# Patient Record
Sex: Male | Born: 1940 | Race: Black or African American | Hispanic: No | Marital: Single
Health system: Southern US, Community
[De-identification: ages and names within clinical notes are randomized; demographics above are authoritative.]

---

## 2014-12-12 ENCOUNTER — Other Ambulatory Visit (HOSPITAL_COMMUNITY): Payer: Self-pay

## 2014-12-12 ENCOUNTER — Inpatient Hospital Stay
Admission: AD | Admit: 2014-12-12 | Discharge: 2015-01-08 | Disposition: E | Payer: Self-pay | Source: Other Acute Inpatient Hospital | Attending: Internal Medicine | Admitting: Internal Medicine

## 2014-12-12 DIAGNOSIS — Z931 Gastrostomy status: Secondary | ICD-10-CM

## 2014-12-12 DIAGNOSIS — I5030 Unspecified diastolic (congestive) heart failure: Secondary | ICD-10-CM

## 2014-12-12 DIAGNOSIS — Z4659 Encounter for fitting and adjustment of other gastrointestinal appliance and device: Secondary | ICD-10-CM

## 2014-12-12 DIAGNOSIS — I82409 Acute embolism and thrombosis of unspecified deep veins of unspecified lower extremity: Secondary | ICD-10-CM

## 2014-12-12 DIAGNOSIS — R140 Abdominal distension (gaseous): Secondary | ICD-10-CM

## 2014-12-12 DIAGNOSIS — J9690 Respiratory failure, unspecified, unspecified whether with hypoxia or hypercapnia: Secondary | ICD-10-CM

## 2014-12-12 DIAGNOSIS — I82403 Acute embolism and thrombosis of unspecified deep veins of lower extremity, bilateral: Secondary | ICD-10-CM

## 2014-12-12 DIAGNOSIS — R0602 Shortness of breath: Secondary | ICD-10-CM

## 2014-12-12 DIAGNOSIS — J9600 Acute respiratory failure, unspecified whether with hypoxia or hypercapnia: Secondary | ICD-10-CM

## 2014-12-12 DIAGNOSIS — I2699 Other pulmonary embolism without acute cor pulmonale: Secondary | ICD-10-CM

## 2014-12-13 ENCOUNTER — Other Ambulatory Visit (HOSPITAL_COMMUNITY): Payer: Self-pay

## 2014-12-13 LAB — BLOOD GAS, ARTERIAL
ACID-BASE EXCESS: 6.1 mmol/L — AB (ref 0.0–2.0)
Bicarbonate: 29.3 mEq/L — ABNORMAL HIGH (ref 20.0–24.0)
O2 Content: 2 L/min
O2 Saturation: 95.3 %
PATIENT TEMPERATURE: 98.6
PCO2 ART: 36.6 mmHg (ref 35.0–45.0)
PH ART: 7.514 — AB (ref 7.350–7.450)
PO2 ART: 74.9 mmHg — AB (ref 80.0–100.0)
TCO2: 30.4 mmol/L (ref 0–100)

## 2014-12-13 LAB — COMPREHENSIVE METABOLIC PANEL
ALT: 46 U/L (ref 17–63)
AST: 42 U/L — ABNORMAL HIGH (ref 15–41)
Albumin: 1.6 g/dL — ABNORMAL LOW (ref 3.5–5.0)
Alkaline Phosphatase: 53 U/L (ref 38–126)
Anion gap: 9 (ref 5–15)
BUN: 12 mg/dL (ref 6–20)
CHLORIDE: 101 mmol/L (ref 101–111)
CO2: 31 mmol/L (ref 22–32)
CREATININE: 0.71 mg/dL (ref 0.61–1.24)
Calcium: 7.6 mg/dL — ABNORMAL LOW (ref 8.9–10.3)
Glucose, Bld: 117 mg/dL — ABNORMAL HIGH (ref 65–99)
POTASSIUM: 2.7 mmol/L — AB (ref 3.5–5.1)
Sodium: 141 mmol/L (ref 135–145)
TOTAL PROTEIN: 5.4 g/dL — AB (ref 6.5–8.1)
Total Bilirubin: 1.1 mg/dL (ref 0.3–1.2)

## 2014-12-13 LAB — URINALYSIS, ROUTINE W REFLEX MICROSCOPIC
Glucose, UA: NEGATIVE mg/dL
HGB URINE DIPSTICK: NEGATIVE
KETONES UR: 40 mg/dL — AB
Leukocytes, UA: NEGATIVE
NITRITE: NEGATIVE
PH: 7 (ref 5.0–8.0)
Protein, ur: NEGATIVE mg/dL
Specific Gravity, Urine: 1.018 (ref 1.005–1.030)
UROBILINOGEN UA: 1 mg/dL (ref 0.0–1.0)

## 2014-12-13 LAB — CBC
HCT: 30.3 % — ABNORMAL LOW (ref 39.0–52.0)
Hemoglobin: 9.9 g/dL — ABNORMAL LOW (ref 13.0–17.0)
MCH: 32.6 pg (ref 26.0–34.0)
MCHC: 32.7 g/dL (ref 30.0–36.0)
MCV: 99.7 fL (ref 78.0–100.0)
PLATELETS: 291 10*3/uL (ref 150–400)
RBC: 3.04 MIL/uL — ABNORMAL LOW (ref 4.22–5.81)
RDW: 15.9 % — AB (ref 11.5–15.5)
WBC: 12.3 10*3/uL — ABNORMAL HIGH (ref 4.0–10.5)

## 2014-12-13 LAB — TSH: TSH: 0.789 u[IU]/mL (ref 0.350–4.500)

## 2014-12-13 LAB — PROCALCITONIN: PROCALCITONIN: 0.35 ng/mL

## 2014-12-14 LAB — URINE CULTURE: CULTURE: NO GROWTH

## 2014-12-14 LAB — HEMOGLOBIN AND HEMATOCRIT, BLOOD
HEMATOCRIT: 31.5 % — AB (ref 39.0–52.0)
HEMOGLOBIN: 10 g/dL — AB (ref 13.0–17.0)

## 2014-12-15 ENCOUNTER — Other Ambulatory Visit (HOSPITAL_COMMUNITY): Payer: Self-pay

## 2014-12-15 LAB — BASIC METABOLIC PANEL
Anion gap: 9 (ref 5–15)
BUN: 15 mg/dL (ref 6–20)
CHLORIDE: 102 mmol/L (ref 101–111)
CO2: 34 mmol/L — ABNORMAL HIGH (ref 22–32)
CREATININE: 0.7 mg/dL (ref 0.61–1.24)
Calcium: 7.8 mg/dL — ABNORMAL LOW (ref 8.9–10.3)
GFR calc non Af Amer: >60 mL/min (ref >60)
Glucose, Bld: 160 mg/dL — ABNORMAL HIGH (ref 65–99)
Potassium: 2.5 mmol/L — CL (ref 3.5–5.1)
SODIUM: 145 mmol/L (ref 135–145)

## 2014-12-15 LAB — CBC
HCT: 30.2 % — ABNORMAL LOW (ref 39.0–52.0)
HEMOGLOBIN: 9.9 g/dL — AB (ref 13.0–17.0)
MCH: 32.1 pg (ref 26.0–34.0)
MCHC: 32.8 g/dL (ref 30.0–36.0)
MCV: 98.1 fL (ref 78.0–100.0)
Platelets: 306 10*3/uL (ref 150–400)
RBC: 3.08 MIL/uL — AB (ref 4.22–5.81)
RDW: 16.1 % — ABNORMAL HIGH (ref 11.5–15.5)
WBC: 10.8 10*3/uL — ABNORMAL HIGH (ref 4.0–10.5)

## 2014-12-15 LAB — VANCOMYCIN, TROUGH: VANCOMYCIN TR: 17 ug/mL (ref 10.0–20.0)

## 2014-12-15 LAB — BRAIN NATRIURETIC PEPTIDE: B NATRIURETIC PEPTIDE 5: 384.5 pg/mL — AB (ref 0.0–100.0)

## 2014-12-15 LAB — HEMOGLOBIN A1C
Hgb A1c MFr Bld: 6 % — ABNORMAL HIGH (ref 4.8–5.6)
Mean Plasma Glucose: 126 mg/dL

## 2014-12-16 LAB — POTASSIUM: POTASSIUM: 2.8 mmol/L — AB (ref 3.5–5.1)

## 2014-12-17 ENCOUNTER — Other Ambulatory Visit (HOSPITAL_COMMUNITY): Payer: Self-pay

## 2014-12-17 LAB — URINE MICROSCOPIC-ADD ON

## 2014-12-17 LAB — BASIC METABOLIC PANEL
ANION GAP: 11 (ref 5–15)
BUN: 24 mg/dL — AB (ref 6–20)
CO2: 28 mmol/L (ref 22–32)
Calcium: 7.9 mg/dL — ABNORMAL LOW (ref 8.9–10.3)
Chloride: 108 mmol/L (ref 101–111)
Creatinine, Ser: 0.7 mg/dL (ref 0.61–1.24)
GFR calc Af Amer: >60 mL/min (ref >60)
GLUCOSE: 145 mg/dL — AB (ref 65–99)
POTASSIUM: 2.9 mmol/L — AB (ref 3.5–5.1)
Sodium: 147 mmol/L — ABNORMAL HIGH (ref 135–145)

## 2014-12-17 LAB — BLOOD GAS, ARTERIAL
ACID-BASE EXCESS: 4 mmol/L — AB (ref 0.0–2.0)
BICARBONATE: 27.5 meq/L — AB (ref 20.0–24.0)
DELIVERY SYSTEMS: POSITIVE
EXPIRATORY PAP: 8
FIO2: 0.5
INSPIRATORY PAP: 16
LHR: 16 {breaths}/min
MODE: POSITIVE
O2 SAT: 94.4 %
PH ART: 7.449 (ref 7.350–7.450)
Patient temperature: 101.4
TCO2: 28.7 mmol/L (ref 0–100)
pCO2 arterial: 41 mmHg (ref 35.0–45.0)
pO2, Arterial: 76.8 mmHg — ABNORMAL LOW (ref 80.0–100.0)

## 2014-12-17 LAB — PHOSPHORUS: Phosphorus: <1 mg/dL — CL (ref 2.5–4.6)

## 2014-12-17 LAB — CBC
HEMATOCRIT: 33.6 % — AB (ref 39.0–52.0)
Hemoglobin: 10.7 g/dL — ABNORMAL LOW (ref 13.0–17.0)
MCH: 31.2 pg (ref 26.0–34.0)
MCHC: 31.8 g/dL (ref 30.0–36.0)
MCV: 98 fL (ref 78.0–100.0)
PLATELETS: 292 10*3/uL (ref 150–400)
RBC: 3.43 MIL/uL — AB (ref 4.22–5.81)
RDW: 16.6 % — ABNORMAL HIGH (ref 11.5–15.5)
WBC: 17.2 10*3/uL — ABNORMAL HIGH (ref 4.0–10.5)

## 2014-12-17 LAB — URINALYSIS, ROUTINE W REFLEX MICROSCOPIC
Bilirubin Urine: NEGATIVE
GLUCOSE, UA: NEGATIVE mg/dL
KETONES UR: NEGATIVE mg/dL
LEUKOCYTES UA: NEGATIVE
NITRITE: NEGATIVE
PH: 6 (ref 5.0–8.0)
Protein, ur: NEGATIVE mg/dL
Specific Gravity, Urine: 1.019 (ref 1.005–1.030)
Urobilinogen, UA: 1 mg/dL (ref 0.0–1.0)

## 2014-12-17 LAB — BRAIN NATRIURETIC PEPTIDE: B NATRIURETIC PEPTIDE 5: 499.2 pg/mL — AB (ref 0.0–100.0)

## 2014-12-17 LAB — VANCOMYCIN, TROUGH: VANCOMYCIN TR: 24 ug/mL — AB (ref 10.0–20.0)

## 2014-12-17 LAB — MAGNESIUM: Magnesium: 1.8 mg/dL (ref 1.7–2.4)

## 2014-12-18 ENCOUNTER — Other Ambulatory Visit (HOSPITAL_COMMUNITY): Payer: Self-pay

## 2014-12-18 LAB — BASIC METABOLIC PANEL
ANION GAP: 11 (ref 5–15)
BUN: 19 mg/dL (ref 6–20)
CHLORIDE: 104 mmol/L (ref 101–111)
CO2: 31 mmol/L (ref 22–32)
Calcium: 8.1 mg/dL — ABNORMAL LOW (ref 8.9–10.3)
Creatinine, Ser: 0.76 mg/dL (ref 0.61–1.24)
GFR calc Af Amer: >60 mL/min (ref >60)
GLUCOSE: 168 mg/dL — AB (ref 65–99)
POTASSIUM: 3.4 mmol/L — AB (ref 3.5–5.1)
Sodium: 146 mmol/L — ABNORMAL HIGH (ref 135–145)

## 2014-12-18 LAB — PHOSPHORUS: Phosphorus: 2.8 mg/dL (ref 2.5–4.6)

## 2014-12-18 LAB — PROTIME-INR
INR: 1.24 (ref 0.00–1.49)
Prothrombin Time: 15.8 seconds — ABNORMAL HIGH (ref 11.6–15.2)

## 2014-12-18 LAB — AMIODARONE LEVEL
AMIODARONE LVL: NOT DETECTED ug/mL (ref 1.0–2.5)
N-Desethyl-Amiodarone: NOT DETECTED ug/mL (ref 1.0–2.5)

## 2014-12-18 LAB — MAGNESIUM: MAGNESIUM: 1.8 mg/dL (ref 1.7–2.4)

## 2014-12-18 MED ORDER — IOHEXOL 350 MG/ML SOLN
100.0000 mL | Freq: Once | INTRAVENOUS | Status: AC | PRN
  Administered 2014-12-18: 80 mL via INTRAVENOUS

## 2014-12-19 LAB — CBC WITH DIFFERENTIAL/PLATELET
BASOS PCT: 0 %
Basophils Absolute: 0 10*3/uL (ref 0.0–0.1)
EOS ABS: 0 10*3/uL (ref 0.0–0.7)
EOS PCT: 0 %
HCT: 29.4 % — ABNORMAL LOW (ref 39.0–52.0)
Hemoglobin: 9.8 g/dL — ABNORMAL LOW (ref 13.0–17.0)
LYMPHS ABS: 0.9 10*3/uL (ref 0.7–4.0)
Lymphocytes Relative: 6 %
MCH: 32 pg (ref 26.0–34.0)
MCHC: 33.3 g/dL (ref 30.0–36.0)
MCV: 96.1 fL (ref 78.0–100.0)
Monocytes Absolute: 0.7 10*3/uL (ref 0.1–1.0)
Monocytes Relative: 5 %
NEUTROS PCT: 89 %
Neutro Abs: 13.6 10*3/uL — ABNORMAL HIGH (ref 1.7–7.7)
PLATELETS: 226 10*3/uL (ref 150–400)
RBC: 3.06 MIL/uL — AB (ref 4.22–5.81)
RDW: 16.9 % — ABNORMAL HIGH (ref 11.5–15.5)
WBC: 15.3 10*3/uL — AB (ref 4.0–10.5)

## 2014-12-19 LAB — PHOSPHORUS
PHOSPHORUS: 2.6 mg/dL (ref 2.5–4.6)
PHOSPHORUS: 2.8 mg/dL (ref 2.5–4.6)

## 2014-12-19 LAB — BASIC METABOLIC PANEL
Anion gap: 10 (ref 5–15)
BUN: 23 mg/dL — AB (ref 6–20)
CO2: 30 mmol/L (ref 22–32)
CREATININE: 0.91 mg/dL (ref 0.61–1.24)
Calcium: 7.5 mg/dL — ABNORMAL LOW (ref 8.9–10.3)
Chloride: 101 mmol/L (ref 101–111)
Glucose, Bld: 183 mg/dL — ABNORMAL HIGH (ref 65–99)
POTASSIUM: 3 mmol/L — AB (ref 3.5–5.1)
SODIUM: 141 mmol/L (ref 135–145)

## 2014-12-19 LAB — TRIGLYCERIDES: TRIGLYCERIDES: 83 mg/dL (ref <150)

## 2014-12-19 LAB — COMPREHENSIVE METABOLIC PANEL
ALK PHOS: 95 U/L (ref 38–126)
ALT: 46 U/L (ref 17–63)
AST: 43 U/L — ABNORMAL HIGH (ref 15–41)
Albumin: 1.8 g/dL — ABNORMAL LOW (ref 3.5–5.0)
Anion gap: 11 (ref 5–15)
BILIRUBIN TOTAL: 0.8 mg/dL (ref 0.3–1.2)
BUN: 23 mg/dL — ABNORMAL HIGH (ref 6–20)
CALCIUM: 7.9 mg/dL — AB (ref 8.9–10.3)
CO2: 30 mmol/L (ref 22–32)
CREATININE: 0.83 mg/dL (ref 0.61–1.24)
Chloride: 100 mmol/L — ABNORMAL LOW (ref 101–111)
Glucose, Bld: 153 mg/dL — ABNORMAL HIGH (ref 65–99)
Potassium: 3.1 mmol/L — ABNORMAL LOW (ref 3.5–5.1)
Sodium: 141 mmol/L (ref 135–145)
TOTAL PROTEIN: 6.1 g/dL — AB (ref 6.5–8.1)

## 2014-12-19 LAB — URINE CULTURE

## 2014-12-19 LAB — PROTIME-INR
INR: 1.32 (ref 0.00–1.49)
Prothrombin Time: 16.5 seconds — ABNORMAL HIGH (ref 11.6–15.2)

## 2014-12-19 LAB — MAGNESIUM
MAGNESIUM: 1.9 mg/dL (ref 1.7–2.4)
Magnesium: 2 mg/dL (ref 1.7–2.4)

## 2014-12-20 LAB — CULTURE, RESPIRATORY

## 2014-12-20 LAB — CULTURE, RESPIRATORY W GRAM STAIN: Culture: NO GROWTH

## 2014-12-20 LAB — BASIC METABOLIC PANEL
ANION GAP: 14 (ref 5–15)
BUN: 20 mg/dL (ref 6–20)
CHLORIDE: 99 mmol/L — AB (ref 101–111)
CO2: 32 mmol/L (ref 22–32)
CREATININE: 0.83 mg/dL (ref 0.61–1.24)
Calcium: 8.1 mg/dL — ABNORMAL LOW (ref 8.9–10.3)
GFR calc non Af Amer: >60 mL/min (ref >60)
Glucose, Bld: 234 mg/dL — ABNORMAL HIGH (ref 65–99)
Potassium: 2.8 mmol/L — ABNORMAL LOW (ref 3.5–5.1)
Sodium: 145 mmol/L (ref 135–145)

## 2014-12-20 LAB — CBC WITH DIFFERENTIAL/PLATELET
Basophils Absolute: 0 10*3/uL (ref 0.0–0.1)
Basophils Relative: 0 %
EOS ABS: 0 10*3/uL (ref 0.0–0.7)
Eosinophils Relative: 0 %
HCT: 31.2 % — ABNORMAL LOW (ref 39.0–52.0)
HEMOGLOBIN: 10.4 g/dL — AB (ref 13.0–17.0)
LYMPHS ABS: 0.9 10*3/uL (ref 0.7–4.0)
LYMPHS PCT: 7 %
MCH: 32.4 pg (ref 26.0–34.0)
MCHC: 33.3 g/dL (ref 30.0–36.0)
MCV: 97.2 fL (ref 78.0–100.0)
MONOS PCT: 4 %
Monocytes Absolute: 0.5 10*3/uL (ref 0.1–1.0)
NEUTROS PCT: 89 %
Neutro Abs: 12.4 10*3/uL — ABNORMAL HIGH (ref 1.7–7.7)
Platelets: 210 10*3/uL (ref 150–400)
RBC: 3.21 MIL/uL — AB (ref 4.22–5.81)
RDW: 16.6 % — ABNORMAL HIGH (ref 11.5–15.5)
WBC: 13.9 10*3/uL — ABNORMAL HIGH (ref 4.0–10.5)

## 2014-12-20 LAB — PROTIME-INR
INR: 1.18 (ref 0.00–1.49)
Prothrombin Time: 15.2 seconds (ref 11.6–15.2)

## 2014-12-20 LAB — VANCOMYCIN, TROUGH: VANCOMYCIN TR: 33 ug/mL — AB (ref 10.0–20.0)

## 2014-12-20 LAB — D-DIMER, QUANTITATIVE: D-Dimer, Quant: 6.09 ug/mL-FEU — ABNORMAL HIGH (ref 0.00–0.48)

## 2014-12-21 ENCOUNTER — Encounter (HOSPITAL_BASED_OUTPATIENT_CLINIC_OR_DEPARTMENT_OTHER): Payer: Self-pay

## 2014-12-21 DIAGNOSIS — I82403 Acute embolism and thrombosis of unspecified deep veins of lower extremity, bilateral: Secondary | ICD-10-CM

## 2014-12-21 LAB — VANCOMYCIN, RANDOM: VANCOMYCIN RM: 5 ug/mL

## 2014-12-21 LAB — PROTIME-INR
INR: 1.19 (ref 0.00–1.49)
Prothrombin Time: 15.2 seconds (ref 11.6–15.2)

## 2014-12-21 LAB — MAGNESIUM: MAGNESIUM: 2.2 mg/dL (ref 1.7–2.4)

## 2014-12-21 LAB — POTASSIUM: POTASSIUM: 2.9 mmol/L — AB (ref 3.5–5.1)

## 2014-12-21 LAB — VANCOMYCIN, TROUGH: VANCOMYCIN TR: 7 ug/mL — AB (ref 10.0–20.0)

## 2014-12-21 NOTE — Progress Notes (Signed)
*  Preliminary Results* Bilateral lower extremity venous duplex completed. The right lower extremity is negative for deep vein thrombosis. The left lower extremity is positive for deep vein thrombosis involving the left saphenofemoral junction, common femoral, femoral, posterior tibial, and peroneal veins. There is no evidence of Baker's cyst bilaterally.  Preliminary results discussed with Dr. Sharyon MedicusHijazi.  12/21/2014  Gertie FeyMichelle Myrah Strawderman, RVT, RDCS, RDMS

## 2014-12-22 LAB — POTASSIUM: Potassium: 3.2 mmol/L — ABNORMAL LOW (ref 3.5–5.1)

## 2014-12-22 LAB — PROTIME-INR
INR: 1.26 (ref 0.00–1.49)
PROTHROMBIN TIME: 15.9 s — AB (ref 11.6–15.2)

## 2014-12-23 LAB — POTASSIUM: Potassium: 3.2 mmol/L — ABNORMAL LOW (ref 3.5–5.1)

## 2014-12-23 LAB — PROTIME-INR
INR: 1.2 (ref 0.00–1.49)
PROTHROMBIN TIME: 15.4 s — AB (ref 11.6–15.2)

## 2014-12-24 ENCOUNTER — Institutional Professional Consult (permissible substitution) (HOSPITAL_COMMUNITY): Payer: Self-pay

## 2014-12-24 ENCOUNTER — Other Ambulatory Visit (HOSPITAL_COMMUNITY): Payer: Self-pay

## 2014-12-24 LAB — BLOOD GAS, ARTERIAL
ACID-BASE EXCESS: 3.6 mmol/L — AB (ref 0.0–2.0)
Bicarbonate: 26.7 mEq/L — ABNORMAL HIGH (ref 20.0–24.0)
Delivery systems: POSITIVE
EXPIRATORY PAP: 8
FIO2: 0.7
INSPIRATORY PAP: 16
O2 SAT: 98.9 %
PCO2 ART: 37.1 mmHg (ref 35.0–45.0)
PH ART: 7.478 — AB (ref 7.350–7.450)
PO2 ART: 165 mmHg — AB (ref 80.0–100.0)
Patient temperature: 101.2
TCO2: 27.8 mmol/L (ref 0–100)

## 2014-12-24 LAB — COMPREHENSIVE METABOLIC PANEL
ALBUMIN: 1.8 g/dL — AB (ref 3.5–5.0)
ALBUMIN: 1.8 g/dL — AB (ref 3.5–5.0)
ALK PHOS: 97 U/L (ref 38–126)
ALK PHOS: 90 U/L (ref 38–126)
ALT: 69 U/L — ABNORMAL HIGH (ref 17–63)
ALT: 58 U/L (ref 17–63)
ANION GAP: 9 (ref 5–15)
ANION GAP: 7 (ref 5–15)
AST: 39 U/L (ref 15–41)
AST: 35 U/L (ref 15–41)
BILIRUBIN TOTAL: 0.8 mg/dL (ref 0.3–1.2)
BILIRUBIN TOTAL: 0.4 mg/dL (ref 0.3–1.2)
BUN: 43 mg/dL — ABNORMAL HIGH (ref 6–20)
BUN: 46 mg/dL — AB (ref 6–20)
CALCIUM: 8.1 mg/dL — AB (ref 8.9–10.3)
CALCIUM: 7.9 mg/dL — AB (ref 8.9–10.3)
CO2: 32 mmol/L (ref 22–32)
CO2: 29 mmol/L (ref 22–32)
Chloride: 105 mmol/L (ref 101–111)
Chloride: 106 mmol/L (ref 101–111)
Creatinine, Ser: 0.76 mg/dL (ref 0.61–1.24)
Creatinine, Ser: 0.82 mg/dL (ref 0.61–1.24)
GFR calc Af Amer: >60 mL/min (ref >60)
GFR calc non Af Amer: >60 mL/min (ref >60)
GFR calc non Af Amer: >60 mL/min (ref >60)
GLUCOSE: 231 mg/dL — AB (ref 65–99)
GLUCOSE: 254 mg/dL — AB (ref 65–99)
POTASSIUM: 3.4 mmol/L — AB (ref 3.5–5.1)
Potassium: 4.1 mmol/L (ref 3.5–5.1)
SODIUM: 146 mmol/L — AB (ref 135–145)
SODIUM: 142 mmol/L (ref 135–145)
TOTAL PROTEIN: 6.2 g/dL — AB (ref 6.5–8.1)
Total Protein: 6 g/dL — ABNORMAL LOW (ref 6.5–8.1)

## 2014-12-24 LAB — CBC WITH DIFFERENTIAL/PLATELET
BASOS ABS: 0 10*3/uL (ref 0.0–0.1)
BASOS PCT: 0 %
Eosinophils Absolute: 0.1 10*3/uL (ref 0.0–0.7)
Eosinophils Relative: 1 %
HEMATOCRIT: 34.8 % — AB (ref 39.0–52.0)
HEMOGLOBIN: 11.2 g/dL — AB (ref 13.0–17.0)
LYMPHS PCT: 10 %
Lymphs Abs: 1.6 10*3/uL (ref 0.7–4.0)
MCH: 31.7 pg (ref 26.0–34.0)
MCHC: 32.2 g/dL (ref 30.0–36.0)
MCV: 98.6 fL (ref 78.0–100.0)
Monocytes Absolute: 0.4 10*3/uL (ref 0.1–1.0)
Monocytes Relative: 2 %
NEUTROS ABS: 13.6 10*3/uL — AB (ref 1.7–7.7)
NEUTROS PCT: 87 %
Platelets: 257 10*3/uL (ref 150–400)
RBC: 3.53 MIL/uL — AB (ref 4.22–5.81)
RDW: 17.4 % — AB (ref 11.5–15.5)
WBC: 15.7 10*3/uL — AB (ref 4.0–10.5)

## 2014-12-24 LAB — TRIGLYCERIDES
Triglycerides: 152 mg/dL — ABNORMAL HIGH (ref <150)
Triglycerides: 141 mg/dL (ref <150)

## 2014-12-24 LAB — MAGNESIUM
MAGNESIUM: 2.1 mg/dL (ref 1.7–2.4)
Magnesium: 2.1 mg/dL (ref 1.7–2.4)

## 2014-12-24 LAB — PHOSPHORUS: Phosphorus: 1.5 mg/dL — ABNORMAL LOW (ref 2.5–4.6)

## 2014-12-24 LAB — PROTIME-INR
INR: 1.27 (ref 0.00–1.49)
PROTHROMBIN TIME: 16 s — AB (ref 11.6–15.2)

## 2014-12-25 ENCOUNTER — Other Ambulatory Visit (HOSPITAL_COMMUNITY): Payer: Self-pay

## 2014-12-25 LAB — URINALYSIS, ROUTINE W REFLEX MICROSCOPIC
Bilirubin Urine: NEGATIVE
Glucose, UA: NEGATIVE mg/dL
Hgb urine dipstick: NEGATIVE
Ketones, ur: NEGATIVE mg/dL
Nitrite: NEGATIVE
PROTEIN: NEGATIVE mg/dL
SPECIFIC GRAVITY, URINE: 1.031 — AB (ref 1.005–1.030)
Urobilinogen, UA: 1 mg/dL (ref 0.0–1.0)
pH: 5 (ref 5.0–8.0)

## 2014-12-25 LAB — PROTIME-INR
INR: 2.01 — AB (ref 0.00–1.49)
PROTHROMBIN TIME: 22.6 s — AB (ref 11.6–15.2)

## 2014-12-25 LAB — BASIC METABOLIC PANEL
ANION GAP: 11 (ref 5–15)
BUN: 41 mg/dL — ABNORMAL HIGH (ref 6–20)
CALCIUM: 8.1 mg/dL — AB (ref 8.9–10.3)
CO2: 30 mmol/L (ref 22–32)
CREATININE: 1.03 mg/dL (ref 0.61–1.24)
Chloride: 108 mmol/L (ref 101–111)
GLUCOSE: 249 mg/dL — AB (ref 65–99)
Potassium: 4.2 mmol/L (ref 3.5–5.1)
Sodium: 149 mmol/L — ABNORMAL HIGH (ref 135–145)

## 2014-12-25 LAB — C DIFFICILE QUICK SCREEN W PCR REFLEX
C DIFFICILE (CDIFF) TOXIN: NEGATIVE
C DIFFICLE (CDIFF) ANTIGEN: POSITIVE — AB

## 2014-12-25 LAB — BLOOD GAS, ARTERIAL
Acid-Base Excess: 5 mmol/L — ABNORMAL HIGH (ref 0.0–2.0)
BICARBONATE: 28.4 meq/L — AB (ref 20.0–24.0)
DELIVERY SYSTEMS: POSITIVE
Expiratory PAP: 8
FIO2: 0.5
INSPIRATORY PAP: 16
Mode: POSITIVE
O2 Saturation: 98.6 %
PATIENT TEMPERATURE: 98.6
PCO2 ART: 37.6 mmHg (ref 35.0–45.0)
PH ART: 7.491 — AB (ref 7.350–7.450)
PO2 ART: 141 mmHg — AB (ref 80.0–100.0)
TCO2: 29.6 mmol/L (ref 0–100)

## 2014-12-25 LAB — URINE MICROSCOPIC-ADD ON

## 2014-12-26 LAB — CBC WITH DIFFERENTIAL/PLATELET
BASOS PCT: 0 %
Basophils Absolute: 0 10*3/uL (ref 0.0–0.1)
Eosinophils Absolute: 0.4 10*3/uL (ref 0.0–0.7)
Eosinophils Relative: 2 %
HEMATOCRIT: 39.4 % (ref 39.0–52.0)
HEMOGLOBIN: 12.5 g/dL — AB (ref 13.0–17.0)
Lymphocytes Relative: 10 %
Lymphs Abs: 1.9 10*3/uL (ref 0.7–4.0)
MCH: 32.6 pg (ref 26.0–34.0)
MCHC: 31.7 g/dL (ref 30.0–36.0)
MCV: 102.6 fL — AB (ref 78.0–100.0)
MONOS PCT: 3 %
Monocytes Absolute: 0.5 10*3/uL (ref 0.1–1.0)
NEUTROS ABS: 16 10*3/uL — AB (ref 1.7–7.7)
NEUTROS PCT: 85 %
Platelets: 220 10*3/uL (ref 150–400)
RBC: 3.84 MIL/uL — ABNORMAL LOW (ref 4.22–5.81)
RDW: 18.4 % — AB (ref 11.5–15.5)
WBC: 18.8 10*3/uL — AB (ref 4.0–10.5)

## 2014-12-26 LAB — VANCOMYCIN, TROUGH: VANCOMYCIN TR: 23 ug/mL — AB (ref 10.0–20.0)

## 2014-12-26 LAB — RENAL FUNCTION PANEL
ALBUMIN: 1.9 g/dL — AB (ref 3.5–5.0)
ANION GAP: 8 (ref 5–15)
BUN: 34 mg/dL — AB (ref 6–20)
CALCIUM: 8.3 mg/dL — AB (ref 8.9–10.3)
CO2: 31 mmol/L (ref 22–32)
CREATININE: 1.08 mg/dL (ref 0.61–1.24)
Chloride: 115 mmol/L — ABNORMAL HIGH (ref 101–111)
GFR calc Af Amer: >60 mL/min (ref >60)
GFR calc non Af Amer: >60 mL/min (ref >60)
GLUCOSE: 347 mg/dL — AB (ref 65–99)
PHOSPHORUS: 3.8 mg/dL (ref 2.5–4.6)
Potassium: 5.1 mmol/L (ref 3.5–5.1)
SODIUM: 154 mmol/L — AB (ref 135–145)

## 2014-12-26 LAB — HEPATIC FUNCTION PANEL
ALT: 91 U/L — ABNORMAL HIGH (ref 17–63)
AST: 53 U/L — ABNORMAL HIGH (ref 15–41)
Albumin: 1.9 g/dL — ABNORMAL LOW (ref 3.5–5.0)
Alkaline Phosphatase: 104 U/L (ref 38–126)
BILIRUBIN DIRECT: 0.2 mg/dL (ref 0.1–0.5)
BILIRUBIN INDIRECT: 0.5 mg/dL (ref 0.3–0.9)
BILIRUBIN TOTAL: 0.7 mg/dL (ref 0.3–1.2)
Total Protein: 7.2 g/dL (ref 6.5–8.1)

## 2014-12-26 LAB — PROTIME-INR
INR: 3.39 — AB (ref 0.00–1.49)
PROTHROMBIN TIME: 33.6 s — AB (ref 11.6–15.2)

## 2014-12-26 LAB — PROCALCITONIN: Procalcitonin: 1.36 ng/mL

## 2014-12-27 LAB — COMPREHENSIVE METABOLIC PANEL
ALK PHOS: 89 U/L (ref 38–126)
ALT: 80 U/L — ABNORMAL HIGH (ref 17–63)
ANION GAP: 9 (ref 5–15)
AST: 45 U/L — ABNORMAL HIGH (ref 15–41)
Albumin: 1.6 g/dL — ABNORMAL LOW (ref 3.5–5.0)
BILIRUBIN TOTAL: 0.6 mg/dL (ref 0.3–1.2)
BUN: 44 mg/dL — ABNORMAL HIGH (ref 6–20)
CALCIUM: 8.1 mg/dL — AB (ref 8.9–10.3)
CO2: 29 mmol/L (ref 22–32)
Chloride: 118 mmol/L — ABNORMAL HIGH (ref 101–111)
Creatinine, Ser: 1.33 mg/dL — ABNORMAL HIGH (ref 0.61–1.24)
GFR calc non Af Amer: 51 mL/min — ABNORMAL LOW (ref >60)
GFR, EST AFRICAN AMERICAN: 59 mL/min — AB (ref >60)
Glucose, Bld: 425 mg/dL — ABNORMAL HIGH (ref 65–99)
POTASSIUM: 3.9 mmol/L (ref 3.5–5.1)
SODIUM: 156 mmol/L — AB (ref 135–145)
TOTAL PROTEIN: 6.2 g/dL — AB (ref 6.5–8.1)

## 2014-12-27 LAB — CBC WITH DIFFERENTIAL/PLATELET
Basophils Absolute: 0 10*3/uL (ref 0.0–0.1)
Basophils Relative: 0 %
EOS PCT: 3 %
Eosinophils Absolute: 0.5 10*3/uL (ref 0.0–0.7)
HEMATOCRIT: 39.2 % (ref 39.0–52.0)
Hemoglobin: 11.8 g/dL — ABNORMAL LOW (ref 13.0–17.0)
Lymphocytes Relative: 10 %
Lymphs Abs: 2 10*3/uL (ref 0.7–4.0)
MCH: 31.3 pg (ref 26.0–34.0)
MCHC: 30.1 g/dL (ref 30.0–36.0)
MCV: 104 fL — ABNORMAL HIGH (ref 78.0–100.0)
MONO ABS: 0.5 10*3/uL (ref 0.1–1.0)
MONOS PCT: 3 %
NEUTROS ABS: 16.3 10*3/uL — AB (ref 1.7–7.7)
Neutrophils Relative %: 84 %
PLATELETS: 196 10*3/uL (ref 150–400)
RBC: 3.77 MIL/uL — ABNORMAL LOW (ref 4.22–5.81)
RDW: 18.3 % — AB (ref 11.5–15.5)
WBC: 19.4 10*3/uL — ABNORMAL HIGH (ref 4.0–10.5)

## 2014-12-27 LAB — VANCOMYCIN, TROUGH: Vancomycin Tr: 14 ug/mL (ref 10.0–20.0)

## 2014-12-27 LAB — MAGNESIUM: Magnesium: 2.3 mg/dL (ref 1.7–2.4)

## 2014-12-27 LAB — PHOSPHORUS: PHOSPHORUS: 3.5 mg/dL (ref 2.5–4.6)

## 2014-12-27 LAB — PROTIME-INR
INR: 3.67 — AB (ref 0.00–1.49)
PROTHROMBIN TIME: 35.6 s — AB (ref 11.6–15.2)

## 2014-12-28 LAB — BASIC METABOLIC PANEL
ANION GAP: 10 (ref 5–15)
Anion gap: 10 (ref 5–15)
BUN: 43 mg/dL — AB (ref 6–20)
BUN: 54 mg/dL — AB (ref 6–20)
CHLORIDE: 115 mmol/L — AB (ref 101–111)
CO2: 28 mmol/L (ref 22–32)
CO2: 27 mmol/L (ref 22–32)
Calcium: 8 mg/dL — ABNORMAL LOW (ref 8.9–10.3)
Calcium: 8.5 mg/dL — ABNORMAL LOW (ref 8.9–10.3)
Chloride: 127 mmol/L — ABNORMAL HIGH (ref 101–111)
Creatinine, Ser: 1.41 mg/dL — ABNORMAL HIGH (ref 0.61–1.24)
Creatinine, Ser: 1.62 mg/dL — ABNORMAL HIGH (ref 0.61–1.24)
GFR calc Af Amer: 55 mL/min — ABNORMAL LOW (ref >60)
GFR calc non Af Amer: 48 mL/min — ABNORMAL LOW (ref >60)
GFR, EST AFRICAN AMERICAN: 47 mL/min — AB (ref >60)
GFR, EST NON AFRICAN AMERICAN: 40 mL/min — AB (ref >60)
Glucose, Bld: 733 mg/dL (ref 65–99)
Glucose, Bld: 774 mg/dL (ref 65–99)
POTASSIUM: 4.2 mmol/L (ref 3.5–5.1)
POTASSIUM: 5.1 mmol/L (ref 3.5–5.1)
SODIUM: 153 mmol/L — AB (ref 135–145)
SODIUM: 164 mmol/L — AB (ref 135–145)

## 2014-12-28 LAB — CBC WITH DIFFERENTIAL/PLATELET
BASOS ABS: 0 10*3/uL (ref 0.0–0.1)
Basophils Relative: 0 %
EOS ABS: 0.3 10*3/uL (ref 0.0–0.7)
Eosinophils Relative: 2 %
HCT: 33.6 % — ABNORMAL LOW (ref 39.0–52.0)
HEMOGLOBIN: 10.3 g/dL — AB (ref 13.0–17.0)
LYMPHS ABS: 0.8 10*3/uL (ref 0.7–4.0)
LYMPHS PCT: 7 %
MCH: 31.6 pg (ref 26.0–34.0)
MCHC: 30.7 g/dL (ref 30.0–36.0)
MCV: 103.1 fL — AB (ref 78.0–100.0)
Monocytes Absolute: 0.2 10*3/uL (ref 0.1–1.0)
Monocytes Relative: 2 %
Neutro Abs: 10.4 10*3/uL — ABNORMAL HIGH (ref 1.7–7.7)
Neutrophils Relative %: 89 %
Platelets: 166 10*3/uL (ref 150–400)
RBC: 3.26 MIL/uL — AB (ref 4.22–5.81)
RDW: 18.4 % — ABNORMAL HIGH (ref 11.5–15.5)
WBC: 11.7 10*3/uL — ABNORMAL HIGH (ref 4.0–10.5)

## 2014-12-28 LAB — PROTIME-INR
INR: 4.27 — AB (ref 0.00–1.49)
PROTHROMBIN TIME: 39.9 s — AB (ref 11.6–15.2)

## 2014-12-28 LAB — MAGNESIUM: MAGNESIUM: 2.6 mg/dL — AB (ref 1.7–2.4)

## 2014-12-28 LAB — PHOSPHORUS: Phosphorus: 3.5 mg/dL (ref 2.5–4.6)

## 2014-12-28 LAB — VANCOMYCIN, TROUGH: VANCOMYCIN TR: 37 ug/mL — AB (ref 10.0–20.0)

## 2014-12-29 LAB — BLOOD GAS, ARTERIAL
ACID-BASE DEFICIT: 3 mmol/L — AB (ref 0.0–2.0)
BICARBONATE: 20.7 meq/L (ref 20.0–24.0)
Delivery systems: POSITIVE
EXPIRATORY PAP: 8
FIO2: 1
Inspiratory PAP: 16
O2 Saturation: 99.6 %
PATIENT TEMPERATURE: 98.6
PH ART: 7.419 (ref 7.350–7.450)
PO2 ART: 388 mmHg — AB (ref 80.0–100.0)
RATE: 12 resp/min
TCO2: 21.7 mmol/L (ref 0–100)
pCO2 arterial: 32.7 mmHg — ABNORMAL LOW (ref 35.0–45.0)

## 2014-12-29 LAB — CBC WITH DIFFERENTIAL/PLATELET
BASOS ABS: 0 10*3/uL (ref 0.0–0.1)
Basophils Relative: 0 %
EOS ABS: 0.4 10*3/uL (ref 0.0–0.7)
Eosinophils Relative: 4 %
HCT: 31.4 % — ABNORMAL LOW (ref 39.0–52.0)
Hemoglobin: 9.8 g/dL — ABNORMAL LOW (ref 13.0–17.0)
LYMPHS PCT: 10 %
Lymphs Abs: 1.1 10*3/uL (ref 0.7–4.0)
MCH: 31.9 pg (ref 26.0–34.0)
MCHC: 31.2 g/dL (ref 30.0–36.0)
MCV: 102.3 fL — AB (ref 78.0–100.0)
MONO ABS: 0.3 10*3/uL (ref 0.1–1.0)
Monocytes Relative: 3 %
NEUTROS PCT: 83 %
Neutro Abs: 9.4 10*3/uL — ABNORMAL HIGH (ref 1.7–7.7)
PLATELETS: 193 10*3/uL (ref 150–400)
RBC: 3.07 MIL/uL — ABNORMAL LOW (ref 4.22–5.81)
RDW: 19 % — ABNORMAL HIGH (ref 11.5–15.5)
WBC: 11.2 10*3/uL — AB (ref 4.0–10.5)

## 2014-12-29 LAB — BASIC METABOLIC PANEL
Anion gap: 13 (ref 5–15)
BUN: 62 mg/dL — AB (ref 6–20)
CHLORIDE: 130 mmol/L — AB (ref 101–111)
CO2: 23 mmol/L (ref 22–32)
CREATININE: 1.96 mg/dL — AB (ref 0.61–1.24)
Calcium: 7.9 mg/dL — ABNORMAL LOW (ref 8.9–10.3)
GFR calc Af Amer: 37 mL/min — ABNORMAL LOW (ref >60)
GFR, EST NON AFRICAN AMERICAN: 32 mL/min — AB (ref >60)
GLUCOSE: 58 mg/dL — AB (ref 65–99)
POTASSIUM: 5.7 mmol/L — AB (ref 3.5–5.1)
Sodium: 166 mmol/L (ref 135–145)

## 2014-12-29 LAB — PROTIME-INR
INR: 4.48 — AB (ref 0.00–1.49)
PROTHROMBIN TIME: 41.4 s — AB (ref 11.6–15.2)

## 2014-12-29 LAB — MAGNESIUM: Magnesium: 2.9 mg/dL — ABNORMAL HIGH (ref 1.7–2.4)

## 2014-12-29 LAB — PHOSPHORUS: Phosphorus: 4.3 mg/dL (ref 2.5–4.6)

## 2014-12-30 LAB — CBC WITH DIFFERENTIAL/PLATELET
Basophils Absolute: 0 10*3/uL (ref 0.0–0.1)
Basophils Relative: 0 %
EOS ABS: 0.2 10*3/uL (ref 0.0–0.7)
EOS PCT: 2 %
HEMATOCRIT: 27.8 % — AB (ref 39.0–52.0)
Hemoglobin: 8.6 g/dL — ABNORMAL LOW (ref 13.0–17.0)
LYMPHS ABS: 0.6 10*3/uL — AB (ref 0.7–4.0)
Lymphocytes Relative: 7 %
MCH: 31 pg (ref 26.0–34.0)
MCHC: 30.9 g/dL (ref 30.0–36.0)
MCV: 100.4 fL — ABNORMAL HIGH (ref 78.0–100.0)
MONO ABS: 0.2 10*3/uL (ref 0.1–1.0)
Monocytes Relative: 2 %
Neutro Abs: 7.9 10*3/uL — ABNORMAL HIGH (ref 1.7–7.7)
Neutrophils Relative %: 89 %
Platelets: 141 10*3/uL — ABNORMAL LOW (ref 150–400)
RBC: 2.77 MIL/uL — AB (ref 4.22–5.81)
RDW: 19.9 % — AB (ref 11.5–15.5)
WBC: 8.9 10*3/uL (ref 4.0–10.5)

## 2014-12-30 LAB — RENAL FUNCTION PANEL
ALBUMIN: 1.3 g/dL — AB (ref 3.5–5.0)
ANION GAP: 12 (ref 5–15)
BUN: 80 mg/dL — AB (ref 6–20)
CALCIUM: 6.5 mg/dL — AB (ref 8.9–10.3)
CO2: 21 mmol/L — ABNORMAL LOW (ref 22–32)
CREATININE: 3.19 mg/dL — AB (ref 0.61–1.24)
Chloride: 124 mmol/L — ABNORMAL HIGH (ref 101–111)
GFR calc Af Amer: 21 mL/min — ABNORMAL LOW (ref >60)
GFR calc non Af Amer: 18 mL/min — ABNORMAL LOW (ref >60)
GLUCOSE: 154 mg/dL — AB (ref 65–99)
PHOSPHORUS: 5.7 mg/dL — AB (ref 2.5–4.6)
Potassium: 4.2 mmol/L (ref 3.5–5.1)
SODIUM: 157 mmol/L — AB (ref 135–145)

## 2014-12-30 LAB — PROTIME-INR
INR: 5.25 — AB (ref 0.00–1.49)
PROTHROMBIN TIME: 46.6 s — AB (ref 11.6–15.2)

## 2014-12-30 LAB — CULTURE, BLOOD (ROUTINE X 2)
CULTURE: NO GROWTH
Culture: NO GROWTH

## 2014-12-30 LAB — HEMOGLOBIN AND HEMATOCRIT, BLOOD
HCT: 27.3 % — ABNORMAL LOW (ref 39.0–52.0)
Hemoglobin: 8.6 g/dL — ABNORMAL LOW (ref 13.0–17.0)

## 2014-12-30 LAB — TSH: TSH: 1.789 u[IU]/mL (ref 0.350–4.500)

## 2014-12-30 LAB — MAGNESIUM: Magnesium: 2.4 mg/dL (ref 1.7–2.4)

## 2014-12-30 LAB — VANCOMYCIN, TROUGH: Vancomycin Tr: 27 ug/mL (ref 10.0–20.0)

## 2014-12-31 LAB — CBC WITH DIFFERENTIAL/PLATELET
BASOS PCT: 0 %
Basophils Absolute: 0 10*3/uL (ref 0.0–0.1)
EOS PCT: 1 %
Eosinophils Absolute: 0.1 10*3/uL (ref 0.0–0.7)
HEMATOCRIT: 25 % — AB (ref 39.0–52.0)
HEMOGLOBIN: 8.1 g/dL — AB (ref 13.0–17.0)
LYMPHS PCT: 8 %
Lymphs Abs: 0.6 10*3/uL — ABNORMAL LOW (ref 0.7–4.0)
MCH: 31.8 pg (ref 26.0–34.0)
MCHC: 32.4 g/dL (ref 30.0–36.0)
MCV: 98 fL (ref 78.0–100.0)
MONO ABS: 0.1 10*3/uL (ref 0.1–1.0)
MONOS PCT: 1 %
NEUTROS PCT: 90 %
Neutro Abs: 6.6 10*3/uL (ref 1.7–7.7)
Platelets: 89 10*3/uL — ABNORMAL LOW (ref 150–400)
RBC: 2.55 MIL/uL — ABNORMAL LOW (ref 4.22–5.81)
RDW: 18.8 % — ABNORMAL HIGH (ref 11.5–15.5)
WBC MORPHOLOGY: INCREASED
WBC: 7.4 10*3/uL (ref 4.0–10.5)

## 2014-12-31 LAB — COMPREHENSIVE METABOLIC PANEL
ALT: 74 U/L — AB (ref 17–63)
AST: 59 U/L — AB (ref 15–41)
Albumin: 1.2 g/dL — ABNORMAL LOW (ref 3.5–5.0)
Alkaline Phosphatase: 114 U/L (ref 38–126)
Anion gap: 15 (ref 5–15)
BUN: 78 mg/dL — ABNORMAL HIGH (ref 6–20)
CHLORIDE: 122 mmol/L — AB (ref 101–111)
CO2: 18 mmol/L — AB (ref 22–32)
CREATININE: 3.23 mg/dL — AB (ref 0.61–1.24)
Calcium: 5.9 mg/dL — CL (ref 8.9–10.3)
GFR calc non Af Amer: 17 mL/min — ABNORMAL LOW (ref >60)
GFR, EST AFRICAN AMERICAN: 20 mL/min — AB (ref >60)
Glucose, Bld: 225 mg/dL — ABNORMAL HIGH (ref 65–99)
POTASSIUM: 4 mmol/L (ref 3.5–5.1)
SODIUM: 155 mmol/L — AB (ref 135–145)
Total Bilirubin: 0.8 mg/dL (ref 0.3–1.2)
Total Protein: 5.3 g/dL — ABNORMAL LOW (ref 6.5–8.1)

## 2014-12-31 LAB — TRIGLYCERIDES: Triglycerides: 159 mg/dL — ABNORMAL HIGH (ref <150)

## 2014-12-31 LAB — MAGNESIUM: MAGNESIUM: 2.2 mg/dL (ref 1.7–2.4)

## 2014-12-31 LAB — PROTIME-INR
INR: 5.13 (ref 0.00–1.49)
PROTHROMBIN TIME: 45.8 s — AB (ref 11.6–15.2)

## 2014-12-31 LAB — PHOSPHORUS: Phosphorus: 6.1 mg/dL — ABNORMAL HIGH (ref 2.5–4.6)

## 2014-12-31 LAB — VANCOMYCIN, TROUGH: Vancomycin Tr: 19 ug/mL (ref 10.0–20.0)

## 2015-01-01 LAB — CBC WITH DIFFERENTIAL/PLATELET
BASOS ABS: 0 10*3/uL (ref 0.0–0.1)
Basophils Relative: 0 %
EOS ABS: 0.1 10*3/uL (ref 0.0–0.7)
Eosinophils Relative: 1 %
HCT: 24.4 % — ABNORMAL LOW (ref 39.0–52.0)
Hemoglobin: 8 g/dL — ABNORMAL LOW (ref 13.0–17.0)
LYMPHS ABS: 0.8 10*3/uL (ref 0.7–4.0)
Lymphocytes Relative: 12 %
MCH: 31 pg (ref 26.0–34.0)
MCHC: 32.8 g/dL (ref 30.0–36.0)
MCV: 94.6 fL (ref 78.0–100.0)
MONOS PCT: 2 %
Monocytes Absolute: 0.1 10*3/uL (ref 0.1–1.0)
NEUTROS ABS: 5.9 10*3/uL (ref 1.7–7.7)
Neutrophils Relative %: 85 %
PLATELETS: 69 10*3/uL — AB (ref 150–400)
RBC: 2.58 MIL/uL — AB (ref 4.22–5.81)
RDW: 18 % — AB (ref 11.5–15.5)
WBC: 6.9 10*3/uL (ref 4.0–10.5)

## 2015-01-01 LAB — RENAL FUNCTION PANEL
Albumin: 1.3 g/dL — ABNORMAL LOW (ref 3.5–5.0)
Anion gap: 18 — ABNORMAL HIGH (ref 5–15)
BUN: 72 mg/dL — AB (ref 6–20)
CALCIUM: 5.6 mg/dL — AB (ref 8.9–10.3)
CHLORIDE: 114 mmol/L — AB (ref 101–111)
CO2: 17 mmol/L — AB (ref 22–32)
CREATININE: 3.32 mg/dL — AB (ref 0.61–1.24)
GFR calc Af Amer: 20 mL/min — ABNORMAL LOW (ref >60)
GFR calc non Af Amer: 17 mL/min — ABNORMAL LOW (ref >60)
GLUCOSE: 120 mg/dL — AB (ref 65–99)
Phosphorus: 6.7 mg/dL — ABNORMAL HIGH (ref 2.5–4.6)
Potassium: 4.3 mmol/L (ref 3.5–5.1)
SODIUM: 149 mmol/L — AB (ref 135–145)

## 2015-01-01 LAB — PROTIME-INR
INR: 3.9 — AB (ref 0.00–1.49)
PROTHROMBIN TIME: 37.3 s — AB (ref 11.6–15.2)

## 2015-01-01 LAB — MAGNESIUM: MAGNESIUM: 2.1 mg/dL (ref 1.7–2.4)

## 2015-01-08 DEATH — deceased

## 2017-07-27 IMAGING — DX DG CHEST 1V PORT
1 series · 1 of 1 positions shown · non-contrast
Comparison: Chest x-rays dated 12/15/2014 in 12/12/2014.

CLINICAL DATA: Respiratory failure.

EXAM:
PORTABLE CHEST 1 VIEW

[chest ap]
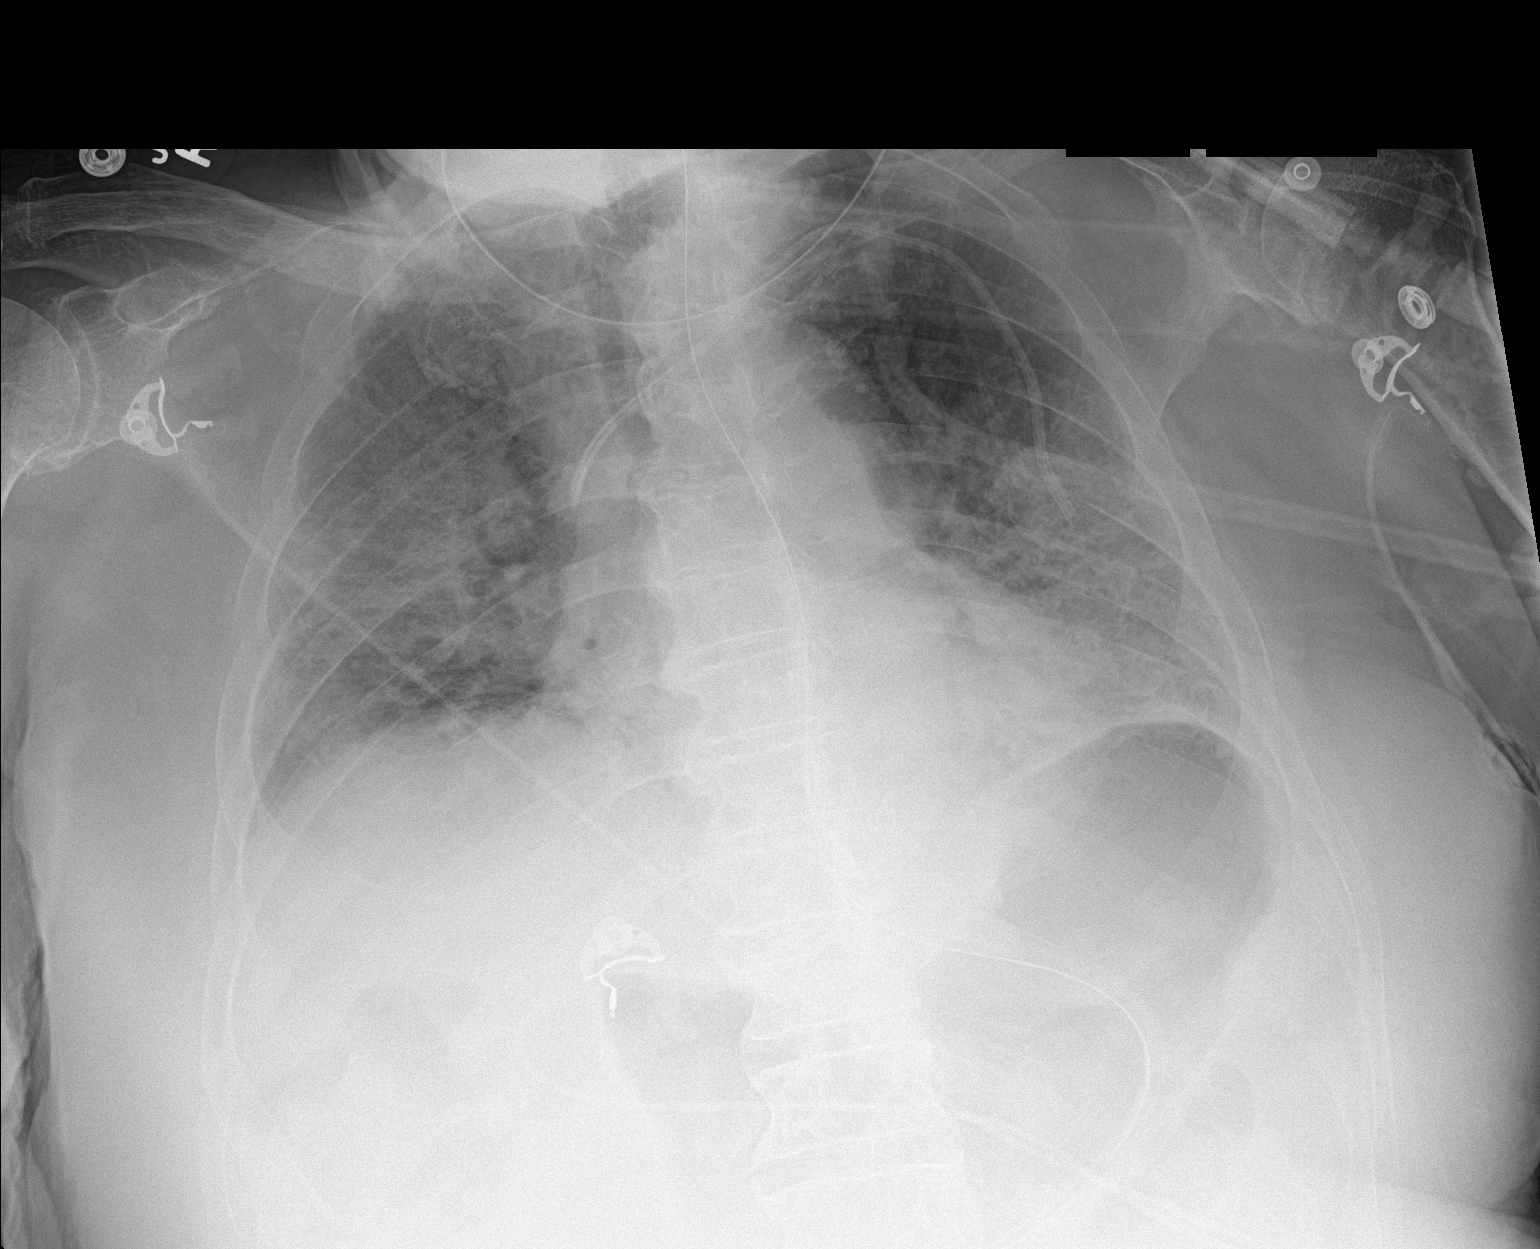

[1 of 1 positions shown; findings below may reference images not displayed]

FINDINGS: The bilateral interstitial thickening and ground-glass opacities has
increased in the interval most suggestive of pulmonary edema. No
pleural effusion seen. No pneumothorax. Cardiomediastinal silhouette
is stable in size and configuration. Heart size is upper normal.
Tubes and lines stable in position.
IMPRESSION: 1. The bilateral interstitial and ground-glass opacities have
increased in extent compared to the previous study. As suggested on
the previous study, this could represent underlying chronic
interstitial lung disease with superimposed pneumonia. This is also
highly suggestive of volume overload/pulmonary edema.
2. Heart size is upper normal.
3. Tubes and lines are stable in position.

## 2017-08-03 IMAGING — CR DG CHEST 1V PORT
1 series · 1 of 1 positions shown · non-contrast
Comparison: 12/24/2014

CLINICAL DATA: Acute respiratory failure

EXAM:
PORTABLE CHEST - 1 VIEW

[AP]
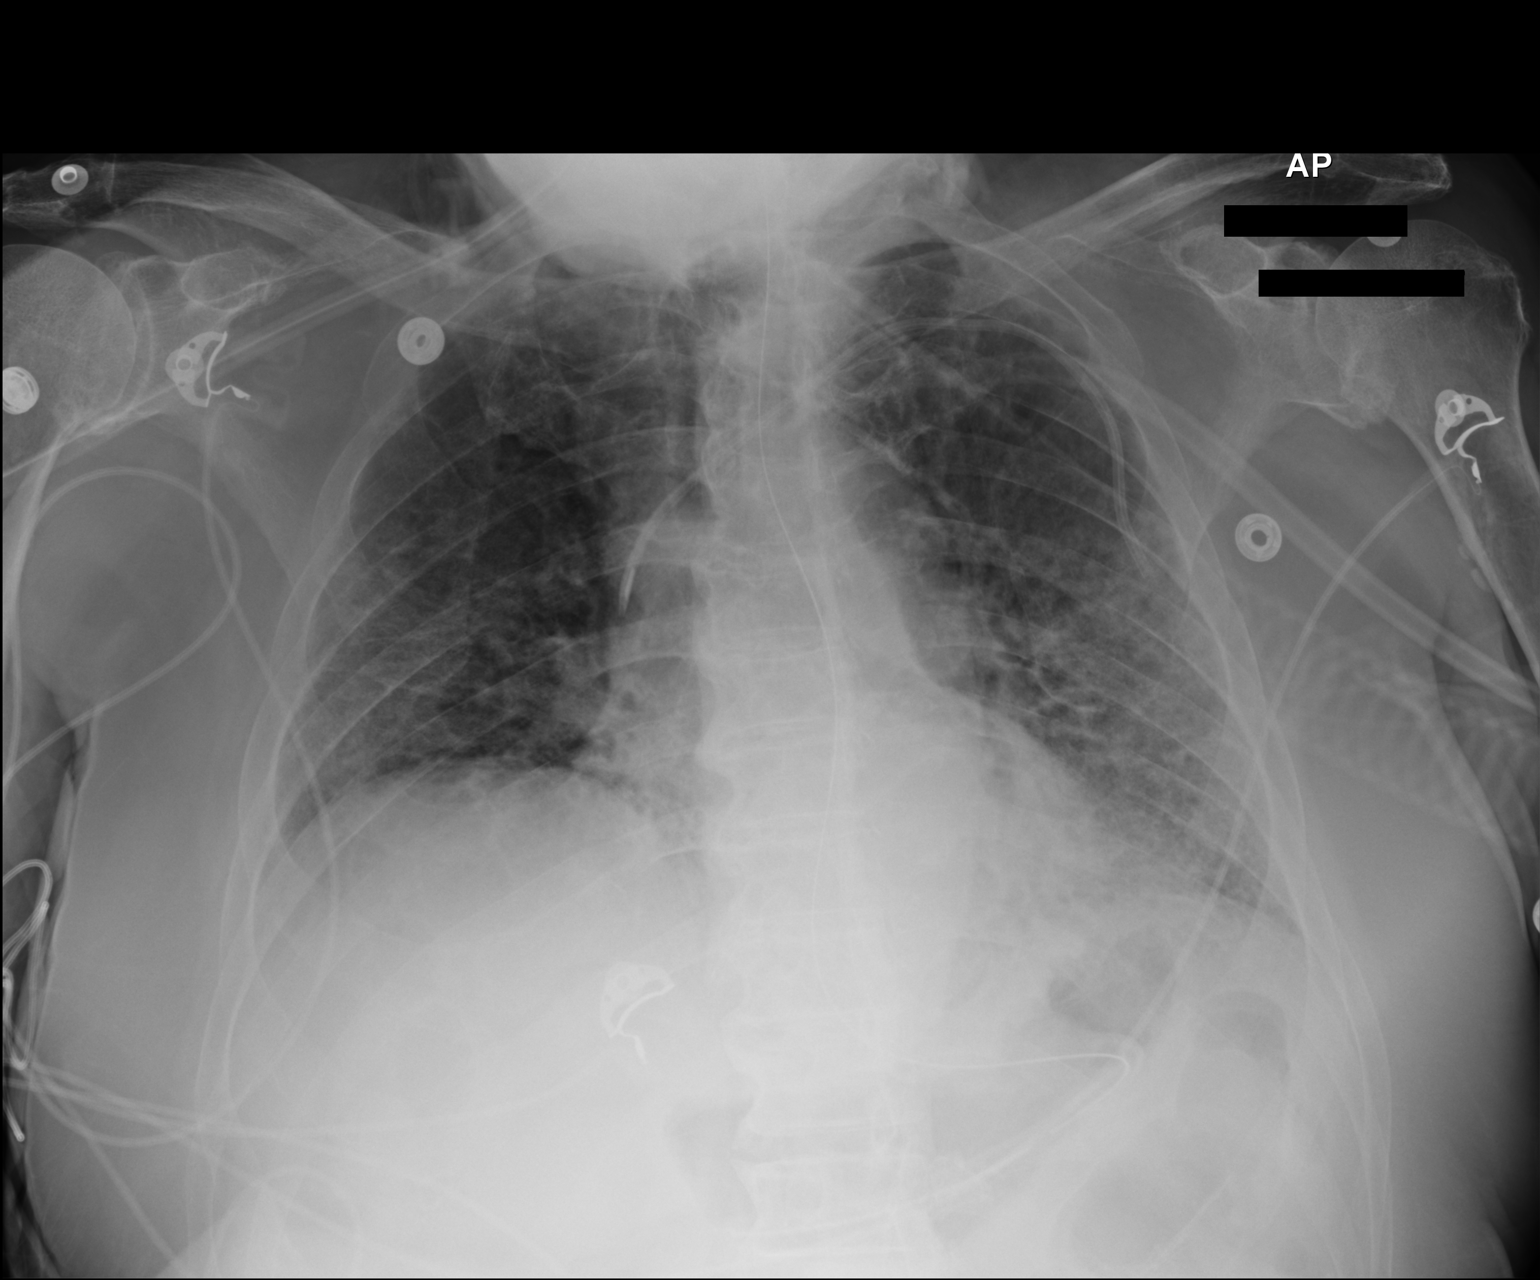

[1 of 1 positions shown; findings below may reference images not displayed]

FINDINGS: Cardiac shadow is stable. A nasogastric catheter is again seen
within the stomach. The left-sided central venous line is noted in
the mid superior vena cava stable from the prior study. Persistent
but improving alveolar infiltrate bilaterally. No new focal
abnormality is seen.
IMPRESSION: No change from earlier in the same day.
